# Patient Record
Sex: Male | Born: 2000 | Race: White | Hispanic: No | Marital: Single
Health system: Southern US, Community
[De-identification: ages and names within clinical notes are randomized; demographics above are authoritative.]

---
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
## 2021-11-02 ENCOUNTER — Emergency Department (HOSPITAL_COMMUNITY): Payer: BC Managed Care – PPO

## 2021-11-02 ENCOUNTER — Inpatient Hospital Stay (HOSPITAL_COMMUNITY)
Admission: EM | Admit: 2021-11-02 | Discharge: 2021-11-04 | DRG: 964 | Disposition: A | Discharge status: Home / Self Care | Payer: BC Managed Care – PPO | Attending: General Surgery | Admitting: General Surgery

## 2021-11-02 ENCOUNTER — Other Ambulatory Visit: Payer: Self-pay

## 2021-11-02 DIAGNOSIS — Y909 Presence of alcohol in blood, level not specified: Secondary | ICD-10-CM | POA: Diagnosis present

## 2021-11-02 DIAGNOSIS — S199XXA Unspecified injury of neck, initial encounter: Secondary | ICD-10-CM | POA: Diagnosis not present

## 2021-11-02 DIAGNOSIS — F10129 Alcohol abuse with intoxication, unspecified: Secondary | ICD-10-CM | POA: Diagnosis not present

## 2021-11-02 DIAGNOSIS — S37052A Moderate laceration of left kidney, initial encounter: Secondary | ICD-10-CM | POA: Diagnosis present

## 2021-11-02 DIAGNOSIS — S32028A Other fracture of second lumbar vertebra, initial encounter for closed fracture: Secondary | ICD-10-CM | POA: Diagnosis present

## 2021-11-02 DIAGNOSIS — S27321A Contusion of lung, unilateral, initial encounter: Secondary | ICD-10-CM | POA: Diagnosis not present

## 2021-11-02 DIAGNOSIS — R0789 Other chest pain: Secondary | ICD-10-CM | POA: Diagnosis not present

## 2021-11-02 DIAGNOSIS — Z1152 Encounter for screening for COVID-19: Secondary | ICD-10-CM

## 2021-11-02 DIAGNOSIS — S272XXA Traumatic hemopneumothorax, initial encounter: Secondary | ICD-10-CM | POA: Diagnosis present

## 2021-11-02 DIAGNOSIS — S36115A Moderate laceration of liver, initial encounter: Secondary | ICD-10-CM | POA: Diagnosis not present

## 2021-11-02 DIAGNOSIS — S22070A Wedge compression fracture of T9-T10 vertebra, initial encounter for closed fracture: Secondary | ICD-10-CM | POA: Diagnosis not present

## 2021-11-02 DIAGNOSIS — S37051A Moderate laceration of right kidney, initial encounter: Secondary | ICD-10-CM | POA: Diagnosis not present

## 2021-11-02 DIAGNOSIS — N179 Acute kidney failure, unspecified: Secondary | ICD-10-CM | POA: Diagnosis not present

## 2021-11-02 DIAGNOSIS — S36039A Unspecified laceration of spleen, initial encounter: Secondary | ICD-10-CM

## 2021-11-02 DIAGNOSIS — J90 Pleural effusion, not elsewhere classified: Secondary | ICD-10-CM | POA: Diagnosis not present

## 2021-11-02 DIAGNOSIS — S0093XA Contusion of unspecified part of head, initial encounter: Secondary | ICD-10-CM | POA: Diagnosis not present

## 2021-11-02 DIAGNOSIS — S32039A Unspecified fracture of third lumbar vertebra, initial encounter for closed fracture: Secondary | ICD-10-CM | POA: Diagnosis not present

## 2021-11-02 DIAGNOSIS — R2681 Unsteadiness on feet: Secondary | ICD-10-CM | POA: Diagnosis not present

## 2021-11-02 DIAGNOSIS — Y92410 Unspecified street and highway as the place of occurrence of the external cause: Secondary | ICD-10-CM | POA: Diagnosis not present

## 2021-11-02 DIAGNOSIS — S36030A Superficial (capsular) laceration of spleen, initial encounter: Secondary | ICD-10-CM | POA: Diagnosis not present

## 2021-11-02 DIAGNOSIS — R4020 Unspecified coma: Secondary | ICD-10-CM | POA: Diagnosis not present

## 2021-11-02 DIAGNOSIS — S36031A Moderate laceration of spleen, initial encounter: Secondary | ICD-10-CM | POA: Diagnosis not present

## 2021-11-02 DIAGNOSIS — S37061A Major laceration of right kidney, initial encounter: Secondary | ICD-10-CM | POA: Diagnosis not present

## 2021-11-02 DIAGNOSIS — R7989 Other specified abnormal findings of blood chemistry: Secondary | ICD-10-CM | POA: Diagnosis present

## 2021-11-02 DIAGNOSIS — Z041 Encounter for examination and observation following transport accident: Secondary | ICD-10-CM | POA: Diagnosis not present

## 2021-11-02 DIAGNOSIS — S32018A Other fracture of first lumbar vertebra, initial encounter for closed fracture: Secondary | ICD-10-CM | POA: Diagnosis present

## 2021-11-02 DIAGNOSIS — S36113A Laceration of liver, unspecified degree, initial encounter: Secondary | ICD-10-CM | POA: Diagnosis not present

## 2021-11-02 DIAGNOSIS — R739 Hyperglycemia, unspecified: Secondary | ICD-10-CM | POA: Diagnosis not present

## 2021-11-02 DIAGNOSIS — M6281 Muscle weakness (generalized): Secondary | ICD-10-CM | POA: Diagnosis not present

## 2021-11-02 DIAGNOSIS — M549 Dorsalgia, unspecified: Secondary | ICD-10-CM | POA: Diagnosis not present

## 2021-11-02 DIAGNOSIS — S37031A Laceration of right kidney, unspecified degree, initial encounter: Secondary | ICD-10-CM

## 2021-11-02 DIAGNOSIS — R079 Chest pain, unspecified: Secondary | ICD-10-CM | POA: Diagnosis not present

## 2021-11-02 DIAGNOSIS — F12929 Cannabis use, unspecified with intoxication, unspecified: Secondary | ICD-10-CM | POA: Diagnosis present

## 2021-11-02 DIAGNOSIS — E876 Hypokalemia: Secondary | ICD-10-CM | POA: Diagnosis present

## 2021-11-02 DIAGNOSIS — R9431 Abnormal electrocardiogram [ECG] [EKG]: Secondary | ICD-10-CM | POA: Diagnosis not present

## 2021-11-02 DIAGNOSIS — S32038A Other fracture of third lumbar vertebra, initial encounter for closed fracture: Secondary | ICD-10-CM | POA: Diagnosis present

## 2021-11-02 DIAGNOSIS — S32009A Unspecified fracture of unspecified lumbar vertebra, initial encounter for closed fracture: Secondary | ICD-10-CM

## 2021-11-02 DIAGNOSIS — S270XXA Traumatic pneumothorax, initial encounter: Secondary | ICD-10-CM

## 2021-11-02 DIAGNOSIS — S2241XA Multiple fractures of ribs, right side, initial encounter for closed fracture: Secondary | ICD-10-CM | POA: Diagnosis present

## 2021-11-02 DIAGNOSIS — S36116A Major laceration of liver, initial encounter: Secondary | ICD-10-CM | POA: Diagnosis not present

## 2021-11-02 LAB — CBC
HCT: 43.2 % (ref 39.0–52.0)
HCT: 40.5 % (ref 39.0–52.0)
Hemoglobin: 14.8 g/dL (ref 13.0–17.0)
Hemoglobin: 13.9 g/dL (ref 13.0–17.0)
MCH: 28.9 pg (ref 26.0–34.0)
MCH: 29.1 pg (ref 26.0–34.0)
MCHC: 34.3 g/dL (ref 30.0–36.0)
MCHC: 34.3 g/dL (ref 30.0–36.0)
MCV: 84.4 fL (ref 80.0–100.0)
MCV: 84.7 fL (ref 80.0–100.0)
Platelets: 367 10*3/uL (ref 150–400)
Platelets: 251 10*3/uL (ref 150–400)
RBC: 5.12 MIL/uL (ref 4.22–5.81)
RBC: 4.78 MIL/uL (ref 4.22–5.81)
RDW: 12.5 % (ref 11.5–15.5)
RDW: 12.6 % (ref 11.5–15.5)
WBC: 14.6 10*3/uL — ABNORMAL HIGH (ref 4.0–10.5)
WBC: 11 10*3/uL — ABNORMAL HIGH (ref 4.0–10.5)
nRBC: 0 % (ref 0.0–0.2)
nRBC: 0 % (ref 0.0–0.2)

## 2021-11-02 LAB — I-STAT CHEM 8, ED
BUN: 11 mg/dL (ref 6–20)
Calcium, Ion: 1.18 mmol/L (ref 1.15–1.40)
Chloride: 98 mmol/L (ref 98–111)
Creatinine, Ser: 1.6 mg/dL — ABNORMAL HIGH (ref 0.61–1.24)
Glucose, Bld: 149 mg/dL — ABNORMAL HIGH (ref 70–99)
HCT: 44 % (ref 39.0–52.0)
Hemoglobin: 15 g/dL (ref 13.0–17.0)
Potassium: 2.9 mmol/L — ABNORMAL LOW (ref 3.5–5.1)
Sodium: 138 mmol/L (ref 135–145)
TCO2: 26 mmol/L (ref 22–32)

## 2021-11-02 LAB — COMPREHENSIVE METABOLIC PANEL
ALT: 328 U/L — ABNORMAL HIGH (ref 0–44)
AST: 432 U/L — ABNORMAL HIGH (ref 15–41)
Albumin: 4.2 g/dL (ref 3.5–5.0)
Alkaline Phosphatase: 65 U/L (ref 38–126)
Anion gap: 11 (ref 5–15)
BUN: 10 mg/dL (ref 6–20)
CO2: 25 mmol/L (ref 22–32)
Calcium: 9.2 mg/dL (ref 8.9–10.3)
Chloride: 101 mmol/L (ref 98–111)
Creatinine, Ser: 1.65 mg/dL — ABNORMAL HIGH (ref 0.61–1.24)
GFR, Estimated: >60 mL/min (ref >60)
Glucose, Bld: 150 mg/dL — ABNORMAL HIGH (ref 70–99)
Potassium: 3 mmol/L — ABNORMAL LOW (ref 3.5–5.1)
Sodium: 137 mmol/L (ref 135–145)
Total Bilirubin: 0.3 mg/dL (ref 0.3–1.2)
Total Protein: 7.1 g/dL (ref 6.5–8.1)

## 2021-11-02 LAB — BASIC METABOLIC PANEL
Anion gap: 7 (ref 5–15)
BUN: 9 mg/dL (ref 6–20)
CO2: 24 mmol/L (ref 22–32)
Calcium: 8.9 mg/dL (ref 8.9–10.3)
Chloride: 103 mmol/L (ref 98–111)
Creatinine, Ser: 1.38 mg/dL — ABNORMAL HIGH (ref 0.61–1.24)
GFR, Estimated: >60 mL/min (ref >60)
Glucose, Bld: 135 mg/dL — ABNORMAL HIGH (ref 70–99)
Potassium: 4.5 mmol/L (ref 3.5–5.1)
Sodium: 134 mmol/L — ABNORMAL LOW (ref 135–145)

## 2021-11-02 LAB — PROTIME-INR
INR: 1 (ref 0.8–1.2)
Prothrombin Time: 13.4 seconds (ref 11.4–15.2)

## 2021-11-02 LAB — SAMPLE TO BLOOD BANK

## 2021-11-02 LAB — RESP PANEL BY RT-PCR (FLU A&B, COVID) ARPGX2
Influenza A by PCR: NEGATIVE
Influenza B by PCR: NEGATIVE
SARS Coronavirus 2 by RT PCR: NEGATIVE

## 2021-11-02 LAB — ETHANOL: Alcohol, Ethyl (B): <10 mg/dL (ref <10)

## 2021-11-02 LAB — HIV ANTIBODY (ROUTINE TESTING W REFLEX): HIV Screen 4th Generation wRfx: NONREACTIVE

## 2021-11-02 MED ORDER — IOHEXOL 300 MG/ML  SOLN
100.0000 mL | Freq: Once | INTRAMUSCULAR | Status: AC | PRN
  Administered 2021-11-02: 100 mL via INTRAVENOUS

## 2021-11-02 MED ORDER — LACTATED RINGERS IV SOLN: INTRAVENOUS | Status: DC

## 2021-11-02 MED ORDER — ACETAMINOPHEN 500 MG PO TABS
1000.0000 mg | ORAL_TABLET | Freq: Four times a day (QID) | ORAL | Status: DC
  Administered 2021-11-02: 1000 mg via ORAL
  Filled 2021-11-02 (×2): qty 2

## 2021-11-02 MED ORDER — SODIUM CHLORIDE 0.9 % IV SOLN: INTRAVENOUS | Status: DC

## 2021-11-02 MED ORDER — METHOCARBAMOL 500 MG PO TABS
1000.0000 mg | ORAL_TABLET | Freq: Three times a day (TID) | ORAL | Status: DC
  Administered 2021-11-02: 1000 mg via ORAL
  Administered 2021-11-03: 500 mg via ORAL
  Filled 2021-11-02 (×5): qty 2

## 2021-11-02 MED ORDER — DIPHENHYDRAMINE HCL 25 MG PO CAPS
25.0000 mg | ORAL_CAPSULE | Freq: Once | ORAL | Status: AC
  Administered 2021-11-02: 25 mg via ORAL
  Filled 2021-11-02: qty 1

## 2021-11-02 MED ORDER — ACETAMINOPHEN 10 MG/ML IV SOLN
1000.0000 mg | Freq: Four times a day (QID) | INTRAVENOUS | Status: AC
  Administered 2021-11-02 – 2021-11-03 (×4): 1000 mg via INTRAVENOUS
  Filled 2021-11-02 (×4): qty 100

## 2021-11-02 MED ORDER — ENOXAPARIN SODIUM 30 MG/0.3ML IJ SOSY
30.0000 mg | PREFILLED_SYRINGE | Freq: Two times a day (BID) | INTRAMUSCULAR | Status: DC
  Administered 2021-11-04: 30 mg via SUBCUTANEOUS
  Filled 2021-11-02: qty 0.3

## 2021-11-02 MED ORDER — ONDANSETRON HCL 4 MG/2ML IJ SOLN
4.0000 mg | Freq: Four times a day (QID) | INTRAMUSCULAR | Status: DC | PRN
  Administered 2021-11-02 – 2021-11-03 (×3): 4 mg via INTRAVENOUS
  Filled 2021-11-02 (×4): qty 2

## 2021-11-02 MED ORDER — SODIUM CHLORIDE 0.9 % IV BOLUS
1000.0000 mL | Freq: Once | INTRAVENOUS | Status: AC
  Administered 2021-11-02: 1000 mL via INTRAVENOUS

## 2021-11-02 MED ORDER — METHOCARBAMOL 1000 MG/10ML IJ SOLN: 500.0000 mg | Freq: Three times a day (TID) | INTRAVENOUS | Status: DC | PRN

## 2021-11-02 MED ORDER — DEXTROSE-NACL 5-0.9 % IV SOLN: INTRAVENOUS | Status: DC

## 2021-11-02 MED ORDER — HYDROMORPHONE HCL 1 MG/ML IJ SOLN
1.0000 mg | INTRAMUSCULAR | Status: DC | PRN
  Administered 2021-11-02 (×2): 1 mg via INTRAVENOUS
  Filled 2021-11-02 (×2): qty 1

## 2021-11-02 MED ORDER — OXYCODONE HCL 5 MG PO TABS
10.0000 mg | ORAL_TABLET | ORAL | Status: DC | PRN
  Administered 2021-11-02 (×2): 10 mg via ORAL
  Filled 2021-11-02 (×2): qty 2

## 2021-11-02 MED ORDER — HYDROMORPHONE HCL 1 MG/ML IJ SOLN
0.5000 mg | Freq: Once | INTRAMUSCULAR | Status: AC
  Administered 2021-11-02: 0.5 mg via INTRAVENOUS
  Filled 2021-11-02: qty 1

## 2021-11-02 MED ORDER — METHOCARBAMOL 500 MG PO TABS: 500.0000 mg | ORAL_TABLET | Freq: Three times a day (TID) | ORAL | Status: DC | PRN

## 2021-11-02 MED ORDER — ONDANSETRON HCL 4 MG/2ML IJ SOLN
4.0000 mg | Freq: Once | INTRAMUSCULAR | Status: AC
  Administered 2021-11-02: 4 mg via INTRAVENOUS
  Filled 2021-11-02: qty 2

## 2021-11-02 MED ORDER — OXYCODONE HCL 5 MG/5ML PO SOLN
5.0000 mg | ORAL | Status: DC | PRN
  Administered 2021-11-02 – 2021-11-04 (×5): 10 mg via ORAL
  Filled 2021-11-02 (×5): qty 10

## 2021-11-02 MED ORDER — ONDANSETRON 4 MG PO TBDP: 4.0000 mg | ORAL_TABLET | Freq: Four times a day (QID) | ORAL | Status: DC | PRN

## 2021-11-02 MED ORDER — FENTANYL CITRATE PF 50 MCG/ML IJ SOSY
50.0000 ug | PREFILLED_SYRINGE | Freq: Once | INTRAMUSCULAR | Status: AC
  Administered 2021-11-02: 50 ug via INTRAVENOUS
  Filled 2021-11-02: qty 1
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
## 2021-11-02 NOTE — Plan of Care (Signed)
Entered in error

## 2021-11-02 NOTE — ED Notes (Signed)
Dr. Cardama at bedside.  

## 2021-11-02 NOTE — ED Notes (Signed)
[place] collar reapplied

## 2021-11-02 NOTE — ED Notes (Signed)
Pt parents at bedside at this time - accompanied by [facility]
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
## 2021-11-02 NOTE — Progress Notes (Signed)
Pt arrived to 4NP13 from ED. Vitals taken and WNL and full assessment completed at this time.   Justice Rocher, RN
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
## 2021-11-02 NOTE — ED Notes (Signed)
BMP and CBC order re modified and shown for 7am

## 2021-11-02 NOTE — ED Notes (Signed)
Pt taken to CT with this RN.

## 2021-11-02 NOTE — ED Notes (Signed)
Pt back from ct

## 2021-11-02 NOTE — Plan of Care (Signed)
  Problem: Education: Goal: Ability to verbalize activity precautions or restrictions will improve Outcome: Not Applicable Goal: Knowledge of the prescribed therapeutic regimen will improve Outcome: Not Applicable Goal: Understanding of discharge needs will improve Outcome: Not Applicable   Problem: Activity: Goal: Ability to avoid complications of mobility impairment will improve Outcome: Not Applicable Goal: Ability to tolerate increased activity will improve Outcome: Not Applicable Goal: Will remain free from falls Outcome: Not Applicable   Problem: Bowel/Gastric: Goal: Gastrointestinal status for postoperative course will improve Outcome: Not Applicable   Problem: Clinical Measurements: Goal: Ability to maintain clinical measurements within normal limits will improve Outcome: Not Applicable Goal: Postoperative complications will be avoided or minimized Outcome: Not Applicable   Problem: Pain Management: Goal: Pain level will decrease Outcome: Not Applicable   Problem: Skin Integrity: Goal: Will show signs of wound healing Outcome: Not Applicable   Problem: Health Behavior/Discharge Planning: Goal: Identification of resources available to assist in meeting health care needs will improve Outcome: Not Applicable   Problem: Bladder/Genitourinary: Goal: Urinary functional status for postoperative course will improve Outcome: Not Applicable

## 2021-11-02 NOTE — ED Notes (Signed)
Breakfast order placed ?

## 2021-11-02 NOTE — Progress Notes (Signed)
Orthopedic Tech Progress Note Patient Details:  Adam Downs 04-09-2001 [id number]  Ortho Devices Type of Ortho Device: Thoracolumbar corset (TLSO) Ortho Device/Splint Location: BACK Ortho Device/Splint Interventions: Ordered, Adjustment   Post Interventions Patient Tolerated: Well Instructions Provided: Care of device  Donald Pore 11/02/2021, 2:45 PM

## 2021-11-02 NOTE — ED Notes (Signed)
Ct to call this RN when ready for scans

## 2021-11-02 NOTE — ED Notes (Signed)
Movement and sensation remain intact in all 4 limbs

## 2021-11-02 NOTE — Progress Notes (Signed)
Patient requested to have neck brace removed. Purpose of brace understood, however patient is adamant that he would like for it to be removed.

## 2021-11-02 NOTE — ED Notes (Signed)
Pt stating that he is having a hard time breathing - cannot sit up at this time - MD notified - pt placed on 2L for comfort

## 2021-11-02 NOTE — ED Notes (Signed)
Pt vomited PO medicine. Pt had zofran at 0823. Will give IV dilaudid.
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
## 2021-11-02 NOTE — Plan of Care (Signed)
  Problem: Clinical Measurements: Goal: Postoperative complications will be avoided or minimized Outcome: Not Applicable   Problem: Skin Integrity: Goal: Will show signs of wound healing Outcome: Not Applicable   Problem: Health Behavior/Discharge Planning: Goal: Identification of resources available to assist in meeting health care needs will improve Outcome: Not Applicable   Problem: Bladder/Genitourinary: Goal: Urinary functional status for postoperative course will improve Outcome: Not Applicable   Problem: Education: Goal: Knowledge of General Education information will improve Description: Including pain rating scale, medication(s)/side effects and non-pharmacologic comfort measures Outcome: Progressing   Problem: Health Behavior/Discharge Planning: Goal: Ability to manage health-related needs will improve Outcome: Progressing   Problem: Clinical Measurements: Goal: Ability to maintain clinical measurements within normal limits will improve Outcome: Progressing   Problem: Pain Managment: Goal: General experience of comfort will improve Outcome: Progressing   Problem: Safety: Goal: Ability to remain free from injury will improve Outcome: Progressing
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
## 2021-11-03 ENCOUNTER — Inpatient Hospital Stay (HOSPITAL_COMMUNITY): Payer: BC Managed Care – PPO

## 2021-11-03 LAB — CBC
HCT: 38.5 % — ABNORMAL LOW (ref 39.0–52.0)
Hemoglobin: 13.4 g/dL (ref 13.0–17.0)
MCH: 29.4 pg (ref 26.0–34.0)
MCHC: 34.8 g/dL (ref 30.0–36.0)
MCV: 84.4 fL (ref 80.0–100.0)
Platelets: 185 10*3/uL (ref 150–400)
RBC: 4.56 MIL/uL (ref 4.22–5.81)
RDW: 12.7 % (ref 11.5–15.5)
WBC: 9.1 10*3/uL (ref 4.0–10.5)
nRBC: 0 % (ref 0.0–0.2)

## 2021-11-03 LAB — BASIC METABOLIC PANEL
Anion gap: 6 (ref 5–15)
BUN: 7 mg/dL (ref 6–20)
CO2: 29 mmol/L (ref 22–32)
Calcium: 9.1 mg/dL (ref 8.9–10.3)
Chloride: 104 mmol/L (ref 98–111)
Creatinine, Ser: 1.08 mg/dL (ref 0.61–1.24)
GFR, Estimated: >60 mL/min (ref >60)
Glucose, Bld: 110 mg/dL — ABNORMAL HIGH (ref 70–99)
Potassium: 4 mmol/L (ref 3.5–5.1)
Sodium: 139 mmol/L (ref 135–145)

## 2021-11-03 MED ORDER — HYDROMORPHONE HCL 1 MG/ML IJ SOLN
1.0000 mg | INTRAMUSCULAR | Status: DC | PRN
  Administered 2021-11-03 – 2021-11-04 (×2): 1 mg via INTRAVENOUS
  Filled 2021-11-03 (×3): qty 1

## 2021-11-03 MED ORDER — CYCLOBENZAPRINE HCL 10 MG PO TABS
10.0000 mg | ORAL_TABLET | Freq: Three times a day (TID) | ORAL | Status: DC
  Administered 2021-11-03 – 2021-11-04 (×3): 10 mg via ORAL
  Filled 2021-11-03 (×3): qty 1

## 2021-11-03 MED ORDER — HYDROMORPHONE HCL 1 MG/ML IJ SOLN
INTRAMUSCULAR | Status: AC
  Administered 2021-11-03: 1 mg via INTRAVENOUS
  Filled 2021-11-03: qty 1

## 2021-11-03 MED ORDER — SORBITOL 70 % SOLN
960.0000 mL | TOPICAL_OIL | Freq: Once | ORAL | Status: AC
  Administered 2021-11-03: 960 mL via RECTAL
  Filled 2021-11-03: qty 473
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
## 2021-11-03 NOTE — Progress Notes (Signed)
Patient complaining of constipation this morning. Last bowel movement early yesterday morning. Patient tried using bed pan without results. Warm fluids offered by mouth. Patient hesitant due to his history of irritable bowel syndrome. Attending paged to request suppository per patient's request.
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
## 2021-11-04 LAB — BASIC METABOLIC PANEL
Anion gap: 6 (ref 5–15)
BUN: 8 mg/dL (ref 6–20)
CO2: 27 mmol/L (ref 22–32)
Calcium: 9 mg/dL (ref 8.9–10.3)
Chloride: 102 mmol/L (ref 98–111)
Creatinine, Ser: 1.01 mg/dL (ref 0.61–1.24)
GFR, Estimated: >60 mL/min (ref >60)
Glucose, Bld: 99 mg/dL (ref 70–99)
Potassium: 3.8 mmol/L (ref 3.5–5.1)
Sodium: 135 mmol/L (ref 135–145)

## 2021-11-04 LAB — CBC
HCT: 37 % — ABNORMAL LOW (ref 39.0–52.0)
Hemoglobin: 12.7 g/dL — ABNORMAL LOW (ref 13.0–17.0)
MCH: 28.8 pg (ref 26.0–34.0)
MCHC: 34.3 g/dL (ref 30.0–36.0)
MCV: 83.9 fL (ref 80.0–100.0)
Platelets: 188 10*3/uL (ref 150–400)
RBC: 4.41 MIL/uL (ref 4.22–5.81)
RDW: 12.3 % (ref 11.5–15.5)
WBC: 8.4 10*3/uL (ref 4.0–10.5)
nRBC: 0 % (ref 0.0–0.2)

## 2021-11-04 MED ORDER — CYCLOBENZAPRINE HCL 10 MG PO TABS: 10.0000 mg | ORAL_TABLET | Freq: Three times a day (TID) | ORAL | 0 refills | Status: AC | PRN

## 2021-11-04 MED ORDER — OXYCODONE HCL 5 MG/5ML PO SOLN: 5.0000 mg | Freq: Four times a day (QID) | ORAL | 0 refills | Status: AC | PRN

## 2021-11-04 NOTE — Plan of Care (Signed)
Patient is being discharged today and will stay with family.
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
## 2021-11-04 NOTE — Discharge Instructions (Signed)
YOU MAY WEAR YOUR BACK BRACE FOR COMFORT. FOLLOW UP WITH DR. Marcello Moores (NEUROSURGERY) IN 2 WEEKS FOR REPEAT X-RAYS OF YOUR BACK.   YOU MAY RETURN TO WORK WHEN CLEARED BY YOUR PCP/DR. THOMAS    AVOID CONTACT SPORTS/SITUATIONS THAT RISK RECURRENT TRAUMA TO THE ABDOMEN FOR 6 WEEKS.

## 2021-11-11 DIAGNOSIS — S22070D Wedge compression fracture of T9-T10 vertebra, subsequent encounter for fracture with routine healing: Secondary | ICD-10-CM | POA: Diagnosis not present

## 2021-12-01 DIAGNOSIS — S36039D Unspecified laceration of spleen, subsequent encounter: Secondary | ICD-10-CM | POA: Diagnosis not present

## 2021-12-03 DIAGNOSIS — S2241XA Multiple fractures of ribs, right side, initial encounter for closed fracture: Secondary | ICD-10-CM | POA: Diagnosis not present

## 2021-12-03 DIAGNOSIS — S36114D Minor laceration of liver, subsequent encounter: Secondary | ICD-10-CM | POA: Diagnosis not present

## 2021-12-03 DIAGNOSIS — J939 Pneumothorax, unspecified: Secondary | ICD-10-CM | POA: Diagnosis not present
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
## 2021-12-23 DIAGNOSIS — S22070D Wedge compression fracture of T9-T10 vertebra, subsequent encounter for fracture with routine healing: Secondary | ICD-10-CM | POA: Diagnosis not present

## 2021-12-23 DIAGNOSIS — S32009D Unspecified fracture of unspecified lumbar vertebra, subsequent encounter for fracture with routine healing: Secondary | ICD-10-CM | POA: Diagnosis not present

## 2021-12-23 DIAGNOSIS — M549 Dorsalgia, unspecified: Secondary | ICD-10-CM | POA: Diagnosis not present

## 2022-01-29 DIAGNOSIS — F411 Generalized anxiety disorder: Secondary | ICD-10-CM | POA: Diagnosis not present
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
## 2023-01-26 DIAGNOSIS — Z0000 Encounter for general adult medical examination without abnormal findings: Secondary | ICD-10-CM | POA: Diagnosis not present

## 2023-01-26 DIAGNOSIS — E7800 Pure hypercholesterolemia, unspecified: Secondary | ICD-10-CM | POA: Diagnosis not present

## 2023-04-12 DIAGNOSIS — F909 Attention-deficit hyperactivity disorder, unspecified type: Secondary | ICD-10-CM | POA: Diagnosis not present

## 2023-04-25 DIAGNOSIS — F909 Attention-deficit hyperactivity disorder, unspecified type: Secondary | ICD-10-CM | POA: Diagnosis not present

## 2023-09-01 DIAGNOSIS — F909 Attention-deficit hyperactivity disorder, unspecified type: Secondary | ICD-10-CM | POA: Diagnosis not present
